# Patient Record
Sex: Male | Born: 1965 | Race: Black or African American | Hispanic: No | Marital: Single | Smoking: Current every day smoker
Health system: Southern US, Community
[De-identification: ages and names within clinical notes are randomized; demographics above are authoritative.]

## PROBLEM LIST (undated history)

## (undated) DIAGNOSIS — I499 Cardiac arrhythmia, unspecified: Secondary | ICD-10-CM

## (undated) DIAGNOSIS — F1010 Alcohol abuse, uncomplicated: Secondary | ICD-10-CM

## (undated) DIAGNOSIS — F32A Depression, unspecified: Secondary | ICD-10-CM

## (undated) DIAGNOSIS — F329 Major depressive disorder, single episode, unspecified: Secondary | ICD-10-CM

---

## 2011-05-28 ENCOUNTER — Emergency Department (HOSPITAL_COMMUNITY)
Admission: EM | Admit: 2011-05-28 | Discharge: 2011-05-28 | Disposition: A | Discharge status: Other Acute Inpatient Hospital | Payer: Self-pay | Attending: Emergency Medicine | Admitting: Emergency Medicine

## 2011-05-28 DIAGNOSIS — F329 Major depressive disorder, single episode, unspecified: Secondary | ICD-10-CM | POA: Insufficient documentation

## 2011-05-28 DIAGNOSIS — I252 Old myocardial infarction: Secondary | ICD-10-CM | POA: Insufficient documentation

## 2011-05-28 DIAGNOSIS — F3289 Other specified depressive episodes: Secondary | ICD-10-CM | POA: Insufficient documentation

## 2011-05-28 LAB — CBC
HCT: 44.5 % (ref 39.0–52.0)
Hemoglobin: 15.6 g/dL (ref 13.0–17.0)
RBC: 4.58 MIL/uL (ref 4.22–5.81)
RDW: 13.5 % (ref 11.5–15.5)
WBC: 6.7 10*3/uL (ref 4.0–10.5)

## 2011-05-28 LAB — COMPREHENSIVE METABOLIC PANEL
AST: 65 U/L — ABNORMAL HIGH (ref 0–37)
BUN: 7 mg/dL (ref 6–23)
CO2: 28 mEq/L (ref 19–32)
Calcium: 9.7 mg/dL (ref 8.4–10.5)
Chloride: 99 mEq/L (ref 96–112)
Creatinine, Ser: 0.81 mg/dL (ref 0.50–1.35)
GFR calc non Af Amer: >60 mL/min (ref >60)
Potassium: 3.7 mEq/L (ref 3.5–5.1)
Sodium: 138 mEq/L (ref 135–145)
Total Bilirubin: 0.5 mg/dL (ref 0.3–1.2)

## 2011-05-28 LAB — ETHANOL: Alcohol, Ethyl (B): <11 mg/dL (ref 0–11)

## 2011-05-28 LAB — URINALYSIS, ROUTINE W REFLEX MICROSCOPIC
Hgb urine dipstick: NEGATIVE
Specific Gravity, Urine: 1.024 (ref 1.005–1.030)
Urobilinogen, UA: 1 mg/dL (ref 0.0–1.0)
pH: 6 (ref 5.0–8.0)

## 2011-05-28 LAB — DIFFERENTIAL
Basophils Absolute: 0 10*3/uL (ref 0.0–0.1)
Eosinophils Relative: 2 % (ref 0–5)
Lymphocytes Relative: 49 % — ABNORMAL HIGH (ref 12–46)
Neutro Abs: 2.6 10*3/uL (ref 1.7–7.7)
Neutrophils Relative %: 39 % — ABNORMAL LOW (ref 43–77)

## 2011-05-28 LAB — RAPID URINE DRUG SCREEN, HOSP PERFORMED
Cocaine: NOT DETECTED
Opiates: NOT DETECTED
Tetrahydrocannabinol: NOT DETECTED

## 2011-09-02 ENCOUNTER — Emergency Department (HOSPITAL_COMMUNITY): Payer: Self-pay

## 2011-09-02 ENCOUNTER — Other Ambulatory Visit: Payer: Self-pay

## 2011-09-02 ENCOUNTER — Inpatient Hospital Stay (HOSPITAL_COMMUNITY): Payer: Self-pay

## 2011-09-02 ENCOUNTER — Encounter (HOSPITAL_COMMUNITY): Payer: Self-pay | Admitting: Radiology

## 2011-09-02 ENCOUNTER — Inpatient Hospital Stay (HOSPITAL_COMMUNITY)
Admission: EM | Admit: 2011-09-02 | Discharge: 2011-09-05 | DRG: 084 | Disposition: A | Discharge status: Home / Self Care | Payer: MEDICAID | Attending: General Surgery | Admitting: General Surgery

## 2011-09-02 DIAGNOSIS — F17200 Nicotine dependence, unspecified, uncomplicated: Secondary | ICD-10-CM | POA: Diagnosis present

## 2011-09-02 DIAGNOSIS — S069X9A Unspecified intracranial injury with loss of consciousness of unspecified duration, initial encounter: Secondary | ICD-10-CM

## 2011-09-02 DIAGNOSIS — Y92410 Unspecified street and highway as the place of occurrence of the external cause: Secondary | ICD-10-CM

## 2011-09-02 DIAGNOSIS — F329 Major depressive disorder, single episode, unspecified: Secondary | ICD-10-CM | POA: Diagnosis present

## 2011-09-02 DIAGNOSIS — S0003XA Contusion of scalp, initial encounter: Secondary | ICD-10-CM | POA: Diagnosis present

## 2011-09-02 DIAGNOSIS — S60519A Abrasion of unspecified hand, initial encounter: Secondary | ICD-10-CM | POA: Diagnosis present

## 2011-09-02 DIAGNOSIS — F3289 Other specified depressive episodes: Secondary | ICD-10-CM | POA: Diagnosis present

## 2011-09-02 DIAGNOSIS — S066X9A Traumatic subarachnoid hemorrhage with loss of consciousness of unspecified duration, initial encounter: Secondary | ICD-10-CM | POA: Diagnosis present

## 2011-09-02 DIAGNOSIS — IMO0002 Reserved for concepts with insufficient information to code with codable children: Secondary | ICD-10-CM | POA: Diagnosis present

## 2011-09-02 DIAGNOSIS — Y998 Other external cause status: Secondary | ICD-10-CM

## 2011-09-02 DIAGNOSIS — F10929 Alcohol use, unspecified with intoxication, unspecified: Secondary | ICD-10-CM

## 2011-09-02 DIAGNOSIS — S0219XA Other fracture of base of skull, initial encounter for closed fracture: Secondary | ICD-10-CM | POA: Diagnosis present

## 2011-09-02 DIAGNOSIS — S062X9A Diffuse traumatic brain injury with loss of consciousness of unspecified duration, initial encounter: Secondary | ICD-10-CM | POA: Diagnosis present

## 2011-09-02 DIAGNOSIS — S0081XA Abrasion of other part of head, initial encounter: Secondary | ICD-10-CM | POA: Diagnosis present

## 2011-09-02 DIAGNOSIS — I609 Nontraumatic subarachnoid hemorrhage, unspecified: Secondary | ICD-10-CM

## 2011-09-02 DIAGNOSIS — S02109A Fracture of base of skull, unspecified side, initial encounter for closed fracture: Principal | ICD-10-CM | POA: Diagnosis present

## 2011-09-02 DIAGNOSIS — S0083XA Contusion of other part of head, initial encounter: Secondary | ICD-10-CM

## 2011-09-02 DIAGNOSIS — S40212A Abrasion of left shoulder, initial encounter: Secondary | ICD-10-CM | POA: Diagnosis present

## 2011-09-02 DIAGNOSIS — S020XXA Fracture of vault of skull, initial encounter for closed fracture: Secondary | ICD-10-CM

## 2011-09-02 DIAGNOSIS — F10229 Alcohol dependence with intoxication, unspecified: Secondary | ICD-10-CM | POA: Diagnosis present

## 2011-09-02 DIAGNOSIS — S1093XA Contusion of unspecified part of neck, initial encounter: Secondary | ICD-10-CM

## 2011-09-02 DIAGNOSIS — S065X9A Traumatic subdural hemorrhage with loss of consciousness of unspecified duration, initial encounter: Secondary | ICD-10-CM

## 2011-09-02 DIAGNOSIS — S0291XA Unspecified fracture of skull, initial encounter for closed fracture: Secondary | ICD-10-CM

## 2011-09-02 HISTORY — DX: Alcohol abuse, uncomplicated: F10.10

## 2011-09-02 HISTORY — DX: Major depressive disorder, single episode, unspecified: F32.9

## 2011-09-02 HISTORY — DX: Depression, unspecified: F32.A

## 2011-09-02 LAB — CBC
HCT: 44.4 % (ref 39.0–52.0)
Hemoglobin: 16 g/dL (ref 13.0–17.0)
MCH: 35.6 pg — ABNORMAL HIGH (ref 26.0–34.0)
MCHC: 36 g/dL (ref 30.0–36.0)
MCV: 98.9 fL (ref 78.0–100.0)
RBC: 4.49 MIL/uL (ref 4.22–5.81)

## 2011-09-02 LAB — POCT I-STAT, CHEM 8
BUN: 5 mg/dL — ABNORMAL LOW (ref 6–23)
Calcium, Ion: 1.08 mmol/L — ABNORMAL LOW (ref 1.12–1.32)
Chloride: 102 mEq/L (ref 96–112)
Glucose, Bld: 105 mg/dL — ABNORMAL HIGH (ref 70–99)
HCT: 49 % (ref 39.0–52.0)
Potassium: 3.8 mEq/L (ref 3.5–5.1)

## 2011-09-02 MED ORDER — ONDANSETRON HCL 4 MG/2ML IJ SOLN
4.0000 mg | Freq: Once | INTRAMUSCULAR | Status: AC
  Administered 2011-09-02: 4 mg via INTRAVENOUS
  Filled 2011-09-02: qty 2

## 2011-09-02 MED ORDER — MORPHINE SULFATE 4 MG/ML IJ SOLN
4.0000 mg | Freq: Once | INTRAMUSCULAR | Status: AC
  Administered 2011-09-02: 4 mg via INTRAVENOUS

## 2011-09-02 MED ORDER — PANTOPRAZOLE SODIUM 40 MG PO TBEC
40.0000 mg | DELAYED_RELEASE_TABLET | Freq: Every day | ORAL | Status: DC
  Administered 2011-09-03 – 2011-09-05 (×3): 40 mg via ORAL
  Filled 2011-09-02 (×3): qty 1

## 2011-09-02 MED ORDER — HYDROMORPHONE HCL PF 1 MG/ML IJ SOLN
1.0000 mg | INTRAMUSCULAR | Status: DC | PRN
  Administered 2011-09-02 (×3): 1 mg via INTRAVENOUS
  Filled 2011-09-02 (×2): qty 1

## 2011-09-02 MED ORDER — PANTOPRAZOLE SODIUM 40 MG IV SOLR
40.0000 mg | Freq: Every day | INTRAVENOUS | Status: DC
  Filled 2011-09-02 (×2): qty 40

## 2011-09-02 MED ORDER — ONDANSETRON HCL 4 MG PO TABS: 4.0000 mg | ORAL_TABLET | Freq: Four times a day (QID) | ORAL | Status: DC | PRN

## 2011-09-02 MED ORDER — SODIUM CHLORIDE 0.9 % IV SOLN
Freq: Once | INTRAVENOUS | Status: AC
  Administered 2011-09-02: 18:00:00 via INTRAVENOUS

## 2011-09-02 MED ORDER — MORPHINE SULFATE 2 MG/ML IJ SOLN
2.0000 mg | INTRAMUSCULAR | Status: DC | PRN
  Administered 2011-09-03: 2 mg via INTRAVENOUS
  Filled 2011-09-02: qty 1

## 2011-09-02 MED ORDER — VITAMIN B-1 100 MG PO TABS
100.0000 mg | ORAL_TABLET | Freq: Every day | ORAL | Status: DC
  Administered 2011-09-04 – 2011-09-05 (×2): 100 mg via ORAL
  Filled 2011-09-02 (×3): qty 1

## 2011-09-02 MED ORDER — ONDANSETRON HCL 4 MG/2ML IJ SOLN
INTRAMUSCULAR | Status: AC
  Filled 2011-09-02: qty 2

## 2011-09-02 MED ORDER — MORPHINE SULFATE 4 MG/ML IJ SOLN
INTRAMUSCULAR | Status: AC
  Filled 2011-09-02: qty 1

## 2011-09-02 MED ORDER — MORPHINE SULFATE 2 MG/ML IJ SOLN
2.0000 mg | Freq: Once | INTRAMUSCULAR | Status: AC
  Administered 2011-09-02: 2 mg via INTRAVENOUS
  Filled 2011-09-02 (×2): qty 1

## 2011-09-02 MED ORDER — POTASSIUM CHLORIDE IN NACL 20-0.9 MEQ/L-% IV SOLN
INTRAVENOUS | Status: DC
  Administered 2011-09-03: via INTRAVENOUS
  Filled 2011-09-02 (×3): qty 1000

## 2011-09-02 MED ORDER — ONDANSETRON HCL 4 MG/2ML IJ SOLN
4.0000 mg | Freq: Four times a day (QID) | INTRAMUSCULAR | Status: DC | PRN
  Administered 2011-09-02: 18:00:00 via INTRAVENOUS
  Administered 2011-09-03: 4 mg via INTRAVENOUS
  Filled 2011-09-02: qty 2

## 2011-09-02 MED ORDER — DOCUSATE SODIUM 100 MG PO CAPS
100.0000 mg | ORAL_CAPSULE | Freq: Two times a day (BID) | ORAL | Status: DC
  Administered 2011-09-03 – 2011-09-05 (×5): 100 mg via ORAL
  Filled 2011-09-02 (×6): qty 1

## 2011-09-02 MED ORDER — FOLIC ACID 1 MG PO TABS
1.0000 mg | ORAL_TABLET | Freq: Every day | ORAL | Status: DC
  Administered 2011-09-03 – 2011-09-05 (×3): 1 mg via ORAL
  Filled 2011-09-02 (×3): qty 1

## 2011-09-02 MED ORDER — ONDANSETRON HCL 4 MG/2ML IJ SOLN
4.0000 mg | Freq: Once | INTRAMUSCULAR | Status: AC
  Administered 2011-09-02: 4 mg via INTRAVENOUS
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
## 2011-09-02 NOTE — ED Notes (Signed)
Pt c/o pain inn his lt ear with anabrasion to that side of his head.  Blood coming from the inside of the ear.  He remains alert moves all extremities pupils approx 3.0 equal and react to light

## 2011-09-02 NOTE — ED Notes (Signed)
The pts c-collar has been replaced x 3 he keeps taking it off.  .  His nausea is better

## 2011-09-02 NOTE — ED Notes (Signed)
Dr Andrey Campanile here to see.  The pt just pulled out both of his saline loks.  # 18 angiocath inserted in the rt a-c nss at 40ml.hr.  The pt vomited approx of mostly alcohol smelling liquid with small amounts,  Of food particles.  zofran given for the vomiting po  Order dr Letta Pate.
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
## 2011-09-02 NOTE — Progress Notes (Addendum)
Responded to page.  Spoke briefly with pt about family but pt was not responsive.  Family notified but not located by chaplain. Please page if needed. Dellie Catholic  [phone]  Chaplain was paged to escort wife back to pt's room.  Relationship appears to be strained.  Left wife with pt.  Pt still uncooperative.

## 2011-09-02 NOTE — ED Notes (Signed)
The pt continues to be restless and confused.  He is moving all extremties and his pupils remain equal and reactive.  He also has intermittent nausea restless he keeps attempting to remove his iv.  Wife at the bedside but she appear to have difficulty  Dealing with the pts condition

## 2011-09-02 NOTE — ED Notes (Signed)
The pt is requiring constant .  Portable xray finished.  Iv # 16 rt hand by tia saul rn.  nss to run per caprossi
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
## 2011-09-02 NOTE — ED Notes (Signed)
The pt has removed his c-collar again replaced.  Asking to sit upright.  He continues to c/o a headache.  Moving all his extremities.  His wife remains at the beside.  Pupils equal and react

## 2011-09-02 NOTE — ED Notes (Signed)
Dr Andrey Campanile ordered morphine 2 mg iv for lt ear pain

## 2011-09-02 NOTE — ED Notes (Signed)
hwp here to see. ,pt going to c-t scan

## 2011-09-02 NOTE — ED Notes (Signed)
Dr Jeral Fruit here to see the pt

## 2011-09-02 NOTE — ED Notes (Signed)
Caporossi, MD notified of abnormal lab test results, wants a redraw of blood and test reran; notified lab to restick pt
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
## 2011-09-02 NOTE — ED Notes (Signed)
The  Pt has a bed assignment waiting to give report
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
## 2011-09-02 NOTE — ED Notes (Signed)
Vital signs stable. 
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
## 2011-09-02 NOTE — ED Notes (Signed)
Caporossi, MD handed redrawn lab test results
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
## 2011-09-02 NOTE — ED Notes (Signed)
Family at beside. Family given emotional support. 
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
## 2011-09-02 NOTE — ED Notes (Signed)
The pts pupils are 3.0 equal and reactive.  He is moving all extremities.  C/o a headache.

## 2011-09-02 NOTE — ED Notes (Signed)
The pt is alert but confused his pupils are 3.0 and equal with reaction to light.  He moves all extremities.  He  Continues to take the c-collar off replaced

## 2011-09-03 ENCOUNTER — Inpatient Hospital Stay (HOSPITAL_COMMUNITY): Payer: Self-pay

## 2011-09-03 LAB — CBC
HCT: 40.8 % (ref 39.0–52.0)
MCHC: 35.3 g/dL (ref 30.0–36.0)
MCV: 98.6 fL (ref 78.0–100.0)
RDW: 12.7 % (ref 11.5–15.5)

## 2011-09-03 LAB — MRSA PCR SCREENING: MRSA by PCR: NEGATIVE

## 2011-09-03 LAB — BASIC METABOLIC PANEL
BUN: 8 mg/dL (ref 6–23)
Creatinine, Ser: 0.82 mg/dL (ref 0.50–1.35)
GFR calc Af Amer: >90 mL/min (ref >90)
GFR calc non Af Amer: >90 mL/min (ref >90)

## 2011-09-03 MED ORDER — PNEUMOCOCCAL VAC POLYVALENT 25 MCG/0.5ML IJ INJ
0.5000 mL | INJECTION | INTRAMUSCULAR | Status: AC
  Filled 2011-09-03: qty 0.5

## 2011-09-03 MED ORDER — OXYCODONE HCL 5 MG PO TABS
5.0000 mg | ORAL_TABLET | ORAL | Status: DC | PRN
  Administered 2011-09-03 – 2011-09-04 (×2): 5 mg via ORAL
  Filled 2011-09-03 (×2): qty 1

## 2011-09-03 MED ORDER — INFLUENZA VIRUS VACC SPLIT PF IM SUSP
0.5000 mL | INTRAMUSCULAR | Status: AC
  Filled 2011-09-03: qty 0.5

## 2011-09-03 NOTE — Progress Notes (Signed)
Follow up - Critical Care Medicine Note  Patient Details:    Brandon Brady is an 45 y.o. male.  Lines/tubes :   Microbiology/Sepsis markers: Results for orders placed during the hospital encounter of 09/02/11  MRSA PCR SCREENING     Status: Normal   Collection Time   09/02/11 10:57 PM      Component Value Range Status Comment   MRSA by PCR NEGATIVE  NEGATIVE  Final     Anti-infectives:  Anti-infectives    None      Best Practice/Protocols:  VTE Prophylaxis: Mechanical CIWA protocol to be instituted  Consults: Treatment Team:  Karn Cassis    Events:  Subjective:    Overnight Issues: No new issues tonight.  Objective:  Vital signs for last 24 hours: Temp:  [98.4 F (36.9 C)-99.9 F (37.7 C)] 99.3 F (37.4 C) (12/12 0800) Pulse Rate:  [60-91] 69  (12/12 1100) Resp:  [11-25] 18  (12/12 1100) BP: (88-134)/(44-89) 123/83 mmHg (12/12 1000) SpO2:  [96 %-100 %] 98 % (12/12 1100) Weight:  [156 lb 4.9 oz (70.9 kg)] 156 lb 4.9 oz (70.9 kg) (12/11 2300)  Hemodynamic parameters for last 24 hours:    Intake/Output from previous day: 12/11 0701 - 12/12 0700 In: 1090 [I.V.:1090] Out: 900 [Urine:700; Emesis/NG output:200]  Intake/Output this shift: Total I/O In: 300 [I.V.:300] Out: -   Vent settings for last 24 hours:    Physical Exam:  General: alert, no respiratory distress and occasionally confused according to the nurse Neuro: alert, oriented and nonfocal exam Resp: clear to auscultation bilaterally GI: soft, nontender, BS WNL, no r/g  Results for orders placed during the hospital encounter of 09/02/11 (from the past 24 hour(s))  CBC     Status: Abnormal   Collection Time   09/02/11  4:04 PM      Component Value Range   WBC 11.3 (*) 4.0 - 10.5 (K/uL)   RBC 4.49  4.22 - 5.81 (MIL/uL)   Hemoglobin 16.0  13.0 - 17.0 (g/dL)   HCT 40.9  81.1 - 91.4 (%)   MCV 98.9  78.0 - 100.0 (fL)   MCH 35.6 (*) 26.0 - 34.0 (pg)   MCHC 36.0  30.0 - 36.0 (g/dL)   RDW 78.2  95.6 - 21.3 (%)   Platelets 261  150 - 400 (K/uL)  PROTIME-INR     Status: Normal   Collection Time   09/02/11  4:04 PM      Component Value Range   Prothrombin Time 12.5  11.6 - 15.2 (seconds)   INR 0.91  0.00 - 1.49   ETHANOL     Status: Abnormal   Collection Time   09/02/11  4:04 PM      Component Value Range   Alcohol, Ethyl (B) 247 (*) 0 - 11 (mg/dL)  POCT I-STAT, CHEM 8     Status: Abnormal   Collection Time   09/02/11  4:12 PM      Component Value Range   Sodium 134 (*) 135 - 145 (mEq/L)   Potassium 8.2 (*) 3.5 - 5.1 (mEq/L)   Chloride 103  96 - 112 (mEq/L)   BUN 7  6 - 23 (mg/dL)   Creatinine, Ser 0.86  0.50 - 1.35 (mg/dL)   Glucose, Bld 578 (*) 70 - 99 (mg/dL)   Calcium, Ion 4.69 (*) 1.12 - 1.32 (mmol/L)   TCO2 24  0 - 100 (mmol/L)   Hemoglobin 17.0  13.0 - 17.0 (g/dL)   HCT 50.0  39.0 - 52.0 (%)  POCT I-STAT, CHEM 8     Status: Abnormal   Collection Time   09/02/11  4:43 PM      Component Value Range   Sodium 139  135 - 145 (mEq/L)   Potassium 3.8  3.5 - 5.1 (mEq/L)   Chloride 102  96 - 112 (mEq/L)   BUN 5 (*) 6 - 23 (mg/dL)   Creatinine, Ser 0.86  0.50 - 1.35 (mg/dL)   Glucose, Bld 578 (*) 70 - 99 (mg/dL)   Calcium, Ion 4.69 (*) 1.12 - 1.32 (mmol/L)   TCO2 24  0 - 100 (mmol/L)   Hemoglobin 16.7  13.0 - 17.0 (g/dL)   HCT 62.9  52.8 - 41.3 (%)  MRSA PCR SCREENING     Status: Normal   Collection Time   09/02/11 10:57 PM      Component Value Range   MRSA by PCR NEGATIVE  NEGATIVE   CBC     Status: Abnormal   Collection Time   09/03/11  3:50 AM      Component Value Range   WBC 16.1 (*) 4.0 - 10.5 (K/uL)   RBC 4.14 (*) 4.22 - 5.81 (MIL/uL)   Hemoglobin 14.4  13.0 - 17.0 (g/dL)   HCT 24.4  01.0 - 27.2 (%)   MCV 98.6  78.0 - 100.0 (fL)   MCH 34.8 (*) 26.0 - 34.0 (pg)   MCHC 35.3  30.0 - 36.0 (g/dL)   RDW 53.6  64.4 - 03.4 (%)   Platelets 240  150 - 400 (K/uL)  BASIC METABOLIC PANEL     Status: Abnormal   Collection Time   09/03/11  3:50 AM       Component Value Range   Sodium 137  135 - 145 (mEq/L)   Potassium 4.6  3.5 - 5.1 (mEq/L)   Chloride 102  96 - 112 (mEq/L)   CO2 22  19 - 32 (mEq/L)   Glucose, Bld 117 (*) 70 - 99 (mg/dL)   BUN 8  6 - 23 (mg/dL)   Creatinine, Ser 7.42  0.50 - 1.35 (mg/dL)   Calcium 9.3  8.4 - 59.5 (mg/dL)   GFR calc non Af Amer >90  >90 (mL/min)   GFR calc Af Amer >90  >90 (mL/min)     Assessment/Plan:   NEURO  Altered Mental Status:  drug withdrawl syndrome   Plan: Possibly start CIWA protocol for ETOH withdrawal  PULM  No issues   Plan: No changes  CARDIO  No issues   Plan: No changes  RENAL  No issues   Plan: No changes  GI  No issuesA   Plan: Advance diet  ID  No issues   Plan: No changes  HEME  No new issues   Plan: No changes  ENDO No issues   Plan: No changes  Global Issues  The patients WBC is elevated, but I see no problems with infection.  Will check UA    LOS: 1 day   Additional comments:I reviewed the patient's new clinical lab test results. CBC/Bmet and I reviewed the patients new imaging test results. CT head  Repeat CT head does not show much worsening.  Clinically he is improved.  Critical Care Total Time*: 30 Minutes  Seona Clemenson III,Bristyn Kulesza O 09/03/2011  *Care during the described time interval was provided by me and/or other providers on the critical care team.  I have reviewed this patient's available data, including medical history, events of note, physical examination  and test results as part of my evaluation.

## 2011-09-03 NOTE — Progress Notes (Signed)
Clinical Social Worker completed the psychosocial assessment which can be found in the shadow chart.  Patient did state that he currently drinks alcohol daily (3-5 beers/day) and at times does black out.  Patient understands that he cannot continue with his current drinking habits and acknowledges that treatment may be an option.  Patient agreeable for substance abuse treatment centers.  CSW provided patient with treatment center options.  Clinical Social Worker has completed SBIRT with patient at bedside.  Clinical Social Worker will sign off for now as social work intervention is no longer needed. Please consult [place] again if new need arises.  [address] [place], [place] [phone]

## 2011-09-03 NOTE — Progress Notes (Addendum)
C- COLLAR REMOVED BY PHYSICIAN: J. WYATT AT 11:30A  11:45 Ace wrap applied to L wrist per MD  Lennie Muckle Leigh  12:00 attempted IV access d/t loss of previous IV site. Unsuccessful, pt refused other attempts. MD aware. IV access order d/c.   Holly Bodily
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
## 2011-09-03 NOTE — ED Provider Notes (Signed)
I saw and evaluated the patient, reviewed the resident's note and I agree with the findings and plan.  Nicholes Stairs, MD 09/03/11 [phone]

## 2011-09-04 LAB — POCT I-STAT, CHEM 8
BUN: 7 mg/dL (ref 6–23)
Chloride: 103 meq/L (ref 96–112)
Creatinine, Ser: 1.2 mg/dL (ref 0.50–1.35)
Glucose, Bld: 107 mg/dL — ABNORMAL HIGH (ref 70–99)
Hemoglobin: 17 g/dL (ref 13.0–17.0)
Potassium: 8.2 meq/L (ref 3.5–5.1)
Sodium: 134 meq/L — ABNORMAL LOW (ref 135–145)

## 2011-09-04 MED ORDER — THERA M PLUS PO TABS
1.0000 | ORAL_TABLET | Freq: Every day | ORAL | Status: DC
  Administered 2011-09-04 – 2011-09-05 (×2): 1 via ORAL
  Filled 2011-09-04 (×2): qty 1

## 2011-09-04 MED ORDER — LORAZEPAM 1 MG PO TABS
1.0000 mg | ORAL_TABLET | Freq: Four times a day (QID) | ORAL | Status: DC | PRN
  Filled 2011-09-04: qty 1

## 2011-09-04 MED ORDER — LORAZEPAM 1 MG PO TABS: 0.0000 mg | ORAL_TABLET | Freq: Two times a day (BID) | ORAL | Status: DC

## 2011-09-04 MED ORDER — NICOTINE 21 MG/24HR TD PT24
21.0000 mg | MEDICATED_PATCH | TRANSDERMAL | Status: DC
  Administered 2011-09-04 – 2011-09-05 (×2): 21 mg via TRANSDERMAL
  Filled 2011-09-04 (×3): qty 1

## 2011-09-04 MED ORDER — LORAZEPAM 1 MG PO TABS
0.0000 mg | ORAL_TABLET | Freq: Four times a day (QID) | ORAL | Status: DC
  Administered 2011-09-04 – 2011-09-05 (×5): 1 mg via ORAL
  Filled 2011-09-04 (×4): qty 1

## 2011-09-04 MED ORDER — LORAZEPAM 2 MG/ML IJ SOLN: 1.0000 mg | Freq: Four times a day (QID) | INTRAMUSCULAR | Status: DC | PRN

## 2011-09-04 MED ORDER — OXYCODONE HCL 5 MG PO TABS
5.0000 mg | ORAL_TABLET | ORAL | Status: DC | PRN
  Administered 2011-09-04 (×2): 10 mg via ORAL
  Administered 2011-09-04: 15 mg via ORAL
  Administered 2011-09-04 – 2011-09-05 (×2): 10 mg via ORAL
  Administered 2011-09-05: 15 mg via ORAL
  Administered 2011-09-05: 10 mg via ORAL
  Administered 2011-09-05: 15 mg via ORAL
  Filled 2011-09-04 (×2): qty 3
  Filled 2011-09-04: qty 2
  Filled 2011-09-04: qty 3
  Filled 2011-09-04 (×2): qty 2
  Filled 2011-09-04: qty 3
  Filled 2011-09-04: qty 2
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
## 2011-09-04 NOTE — Progress Notes (Signed)
Patient ID: Brandon Brady, male   DOB: Apr 11, 1966, 45 y.o.   MRN: [id number] Vs seen. Patient stable. No more blood or csf in ear. Decrease of headache. Ct head of yesterday seen.wi get a new ct tomorrow.
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
## 2011-09-05 ENCOUNTER — Inpatient Hospital Stay (HOSPITAL_COMMUNITY): Payer: Self-pay

## 2011-09-05 LAB — BASIC METABOLIC PANEL WITH GFR
BUN: 13 mg/dL (ref 6–23)
CO2: 26 meq/L (ref 19–32)
Calcium: 9.5 mg/dL (ref 8.4–10.5)
Chloride: 96 meq/L (ref 96–112)
Creatinine, Ser: 0.85 mg/dL (ref 0.50–1.35)
GFR calc Af Amer: >90 mL/min
GFR calc non Af Amer: >90 mL/min
Glucose, Bld: 97 mg/dL (ref 70–99)
Potassium: 4.1 meq/L (ref 3.5–5.1)
Sodium: 133 meq/L — ABNORMAL LOW (ref 135–145)

## 2011-09-05 LAB — CBC
MCH: 34.9 pg — ABNORMAL HIGH (ref 26.0–34.0)
MCHC: 35 g/dL (ref 30.0–36.0)
MCV: 99.8 fL (ref 78.0–100.0)
Platelets: 263 10*3/uL (ref 150–400)
RBC: 4.3 MIL/uL (ref 4.22–5.81)

## 2011-09-05 MED ORDER — OXYCODONE HCL 5 MG PO TABS: 5.0000 mg | ORAL_TABLET | ORAL | Status: DC | PRN

## 2011-09-05 MED ORDER — DSS 100 MG PO CAPS: 100.0000 mg | ORAL_CAPSULE | Freq: Two times a day (BID) | ORAL | Status: DC

## 2011-09-05 MED ORDER — THERA M PLUS PO TABS: 1.0000 | ORAL_TABLET | Freq: Every day | ORAL | Status: DC

## 2011-09-05 NOTE — Progress Notes (Signed)
This patient has been seen and I agree with the findings and treatment plan.  Taiz Bickle O. Tora Prunty, III, MD, FACS [phone] (pager) [phone] (direct pager) Trauma Surgeon  

## 2011-09-05 NOTE — Progress Notes (Signed)
Clinical Social Worker received phone call from MD regarding patient family requesting substance use resources on patient behalf.  CSW met with patient, patient wife, and patient uncle at bedside to discuss the possibilities and options for patient in regards to substance abuse treatment.  Patient having a hard time staying awake but provided permission to speak with patient wife and discuss options.  Patient is agreeable to initiate referral to inpatient substance abuse facility.  Patient provided permission for facility to contact patient wife with any follow up needs.  Patient wife and CSW stepped into the hallway to further discuss patient situation.  Patient wife states that they are currently separated and patient has been living on sister's couch.  Patient has been out of work for at least 6 months and expressing signs of serious depression and substance abuse.  Patient wife committed patient about 5 months ago for possible suicidal ideation and patient participated in a 7 day program in [place], [place].  Patient wife is agreeable to have patient return home with her, and provide 24 hour care until patient has had the opportunity to physically heal and admit to residential substance abuse facility.  Patient wife states that patient will not be drinking in her home at discharge.  Clinical Social Worker initiated a referral to [facility] in [place], [place].  CSW spoke with Deland Pretty (Lead Clinician) who made arrangements for patient to participate in intake process on September 18, 2011 at 08:00.  Clinical Social Worker notified patient wife who was very relieved at the idea of patient having an appointment arranged prior to discharge from the hospital.  Clinical Social Worker will provide facility with patient discharge summary upon discharge from this admission in order for facility to continue with patient referral.    Patient wife anxious to get patient home and  settled.  Patient wife is prepared to take patient home at discharge and provide patient with transportation to doctor appointments and residential treatment.    Clinical Social Worker will sign off for now as social work intervention is no longer needed. Please consult [place] again if new need arises.  [address] [place], [place] [phone]

## 2011-09-05 NOTE — Progress Notes (Signed)
Pt d/c in stable condition via wife who stated he will have 24hr supervision. Instructions reviewed with wife and pt. Instructed about follow up appointments and post visit care.

## 2011-09-05 NOTE — Progress Notes (Signed)
Physical Therapy Treatment Patient Details Name: Brandon Brady MRN: [id number] DOB: 03/17/1966 Today's Date: 09/05/2011  PT Assessment/Plan  PT - Assessment/Plan Comments on Treatment Session: Patient presents at [facility] Level [facility]. SLP currently educating spouse regarding brain injury and will supply with brain injury book. Both myself and Shawn RAyburn PA discussed with spouse regarding the need for 24 hour care at discharge. Patients spouse states she can provide if she has note to inform her work.  PT Plan: Discharge plan remains appropriate Follow Up Recommendations: 24 hour supervision/assistance;Outpatient PT Equipment Recommended: None recommended by PT PT Goals  Acute Rehab PT Goals PT Goal: Supine/Side to Sit - Progress: Progressing toward goal PT Goal: Sit to Supine/Side - Progress: Progressing toward goal PT Goal: Sit to Stand - Progress: Progressing toward goal PT Goal: Stand to Sit - Progress: Progressing toward goal PT Goal: Ambulate - Progress: Progressing toward goal  PT Treatment Precautions/Restrictions  Precautions Precautions: Fall Precaution Comments: Secondary to poor awareness and BPPV Restrictions Weight Bearing Restrictions: No Mobility (including Balance) Bed Mobility Rolling Right: 5: Supervision Rolling Right Details (indicate cue type and reason): Increased time to achieve position Right Sidelying to Sit: 4: Min assist Right Sidelying to Sit Details (indicate cue type and reason): Patient with difficulty initiating trunk  Supine to Sit: 4: Min assist;HOB flat Supine to Sit Details (indicate cue type and reason): Patient reaching arms up toward PT as if to ask for assistance, but does not ask. Patient with difficulty initiating secondary to head pain and neck stiffness Sit to Supine - Right: 5: Supervision;HOB elevated (comment degrees) Sit to Supine - Right Details (indicate cue type and reason): 30 degrees Transfers Sit to Stand: 4: Min assist;From  bed;From chair/3-in-1 Sit to Stand Details (indicate cue type and reason): Min-guard assistance  - patient able to initiate today without assistance Stand to Sit: 5: Supervision;To bed;To chair/3-in-1 Stand to Sit Details: Controls descent well to chair Ambulation/Gait Ambulation/Gait Assistance: 4: Min assist Ambulation/Gait Assistance Details (indicate cue type and reason): Min-guard assistance. No evidence of deviation from path with head turns. Speed and stride length increase with increased distance. Patient with tendency to reach for rails in hallways for support.  Ambulation Distance (Feet): 250 Feet Assistive device: None  Static Standing Balance Static Standing - Balance Support: No upper extremity supported Static Standing - Level of Assistance: 5: Stand by assistance Had patient lie from long sitting with bed in Trendelenberg position and head rotated 20 degrees to left. Patient moves slowly secondary to pain and cognition. Nystagmus present for 15 seconds indicating left BPPV posterior canal. Had patient turn head 20 degrees to right -  post dizziness abolished + 30 seconds. Then rolled to right side and then up to sitting looking at floor.  Nystagmus with lower duration today, and overall patient states dizziness less although can not rate. No dizziness with sit to stand or gait today. Unable to assess for possible peri-lymphatic fistula (often associated with temporal bone fractures) secondary to patients inability to accurately follow commands. Exercise  Other Exercises Other Exercises: Educated Brandt-Daroff exercises to patients spouse as patient unable to sustain attention for education. Handout of exercises with full instructions supplied. Also supplied education handout to patient's spouse regarding BPPV End of Session PT - End of Session Equipment Utilized During Treatment: Gait belt Activity Tolerance: Patient limited by fatigue Patient left: in bed;with family/visitor  present General Behavior During Session: Flat affect (Lethargic) Cognition: Impaired Cognitive Impairment: Patient only able to sustain attention for 1  minute maximum today. Orientated to place, situation. Unable to get patient to follow multiple step commands consistantly. Decreased safety awareness. Per spouses report long term memory issues based on discussions she has had with patient. Patient asking if sister has moved to [place] yet - sister lives locally and will not be and has not moved.   Edwyna Perfect, PT  Pager [phone]  09/05/2011, 10:28 AM
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
## 2011-09-11 ENCOUNTER — Encounter (HOSPITAL_COMMUNITY): Payer: Self-pay

## 2011-09-11 ENCOUNTER — Emergency Department (HOSPITAL_COMMUNITY)
Admission: EM | Admit: 2011-09-11 | Discharge: 2011-09-11 | Disposition: A | Discharge status: Home / Self Care | Payer: Self-pay | Attending: Emergency Medicine | Admitting: Emergency Medicine

## 2011-09-11 DIAGNOSIS — R51 Headache: Secondary | ICD-10-CM | POA: Insufficient documentation

## 2011-09-11 DIAGNOSIS — M549 Dorsalgia, unspecified: Secondary | ICD-10-CM | POA: Insufficient documentation

## 2011-09-11 DIAGNOSIS — F17200 Nicotine dependence, unspecified, uncomplicated: Secondary | ICD-10-CM | POA: Insufficient documentation

## 2011-09-11 HISTORY — DX: Cardiac arrhythmia, unspecified: I49.9

## 2011-09-11 MED ORDER — OXYCODONE HCL 15 MG PO TABS: ORAL_TABLET | ORAL | Status: DC

## 2011-09-11 MED ORDER — OXYCODONE HCL 5 MG PO TABS
15.0000 mg | ORAL_TABLET | Freq: Once | ORAL | Status: AC
  Administered 2011-09-11: 15 mg via ORAL
  Filled 2011-09-11: qty 3
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
## 2011-09-11 NOTE — ED Notes (Signed)
Pt signed for discharge instructions but I accidentally hit cancel.  Written instructions and Rx given to pt with verbal understanding voiced.
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
## 2011-10-01 NOTE — Discharge Summary (Signed)
This patient has been seen and I agree with the findings and treatment plan.  Azarya Oconnell O. Gregroy Dombkowski, III, MD, FACS [phone] (pager) [phone] (direct pager) Trauma Surgeon  

## 2012-09-17 ENCOUNTER — Encounter (HOSPITAL_BASED_OUTPATIENT_CLINIC_OR_DEPARTMENT_OTHER): Payer: Self-pay

## 2012-09-17 ENCOUNTER — Emergency Department (HOSPITAL_BASED_OUTPATIENT_CLINIC_OR_DEPARTMENT_OTHER)
Admission: EM | Admit: 2012-09-17 | Discharge: 2012-09-17 | Disposition: A | Discharge status: Home / Self Care | Payer: Self-pay | Attending: Emergency Medicine | Admitting: Emergency Medicine

## 2012-09-17 ENCOUNTER — Emergency Department (HOSPITAL_BASED_OUTPATIENT_CLINIC_OR_DEPARTMENT_OTHER): Payer: Self-pay

## 2012-09-17 DIAGNOSIS — F1010 Alcohol abuse, uncomplicated: Secondary | ICD-10-CM | POA: Insufficient documentation

## 2012-09-17 DIAGNOSIS — Z87828 Personal history of other (healed) physical injury and trauma: Secondary | ICD-10-CM | POA: Insufficient documentation

## 2012-09-17 DIAGNOSIS — F17200 Nicotine dependence, unspecified, uncomplicated: Secondary | ICD-10-CM | POA: Insufficient documentation

## 2012-09-17 DIAGNOSIS — Z8659 Personal history of other mental and behavioral disorders: Secondary | ICD-10-CM | POA: Insufficient documentation

## 2012-09-17 DIAGNOSIS — Z8679 Personal history of other diseases of the circulatory system: Secondary | ICD-10-CM | POA: Insufficient documentation

## 2012-09-17 DIAGNOSIS — R51 Headache: Secondary | ICD-10-CM | POA: Insufficient documentation

## 2012-09-17 DIAGNOSIS — R42 Dizziness and giddiness: Secondary | ICD-10-CM | POA: Insufficient documentation
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
## 2012-09-25 NOTE — ED Provider Notes (Signed)
Medical screening examination/treatment/procedure(s) were performed by non-physician practitioner and as supervising physician I was immediately available for consultation/collaboration.   Gwyneth Sprout, MD 09/25/12 1003
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
## 2015-06-14 ENCOUNTER — Ambulatory Visit (INDEPENDENT_AMBULATORY_CARE_PROVIDER_SITE_OTHER): Payer: BLUE CROSS/BLUE SHIELD | Admitting: Psychiatry

## 2015-06-14 ENCOUNTER — Encounter (INDEPENDENT_AMBULATORY_CARE_PROVIDER_SITE_OTHER): Payer: Self-pay

## 2015-06-14 ENCOUNTER — Encounter (HOSPITAL_COMMUNITY): Payer: Self-pay | Admitting: Psychiatry

## 2015-06-14 VITALS — BP 116/78 | HR 78 | Ht 70.5 in | Wt 153.8 lb

## 2015-06-14 DIAGNOSIS — F339 Major depressive disorder, recurrent, unspecified: Secondary | ICD-10-CM | POA: Diagnosis not present

## 2015-06-14 DIAGNOSIS — S069XAS Unspecified intracranial injury with loss of consciousness status unknown, sequela: Secondary | ICD-10-CM

## 2015-06-14 DIAGNOSIS — F1024 Alcohol dependence with alcohol-induced mood disorder: Secondary | ICD-10-CM

## 2015-06-14 DIAGNOSIS — F17200 Nicotine dependence, unspecified, uncomplicated: Secondary | ICD-10-CM | POA: Insufficient documentation

## 2015-06-14 DIAGNOSIS — F322 Major depressive disorder, single episode, severe without psychotic features: Secondary | ICD-10-CM

## 2015-06-14 DIAGNOSIS — S020XXS Fracture of vault of skull, sequela: Secondary | ICD-10-CM

## 2015-06-14 DIAGNOSIS — S069X9S Unspecified intracranial injury with loss of consciousness of unspecified duration, sequela: Secondary | ICD-10-CM

## 2015-06-14 DIAGNOSIS — S0219XA Other fracture of base of skull, initial encounter for closed fracture: Secondary | ICD-10-CM | POA: Insufficient documentation

## 2015-06-14 DIAGNOSIS — S069X9A Unspecified intracranial injury with loss of consciousness of unspecified duration, initial encounter: Secondary | ICD-10-CM

## 2015-06-14 DIAGNOSIS — S0219XS Other fracture of base of skull, sequela: Secondary | ICD-10-CM

## 2015-06-14 DIAGNOSIS — F17210 Nicotine dependence, cigarettes, uncomplicated: Secondary | ICD-10-CM

## 2015-06-14 DIAGNOSIS — F411 Generalized anxiety disorder: Secondary | ICD-10-CM | POA: Diagnosis not present

## 2015-06-14 DIAGNOSIS — F6381 Intermittent explosive disorder: Secondary | ICD-10-CM | POA: Insufficient documentation

## 2015-06-14 DIAGNOSIS — F1020 Alcohol dependence, uncomplicated: Secondary | ICD-10-CM

## 2015-06-14 MED ORDER — RISPERIDONE 1 MG PO TABS: 1.0000 mg | ORAL_TABLET | Freq: Every day | ORAL | Status: DC
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
## 2015-06-27 ENCOUNTER — Ambulatory Visit (HOSPITAL_COMMUNITY)
Admission: RE | Admit: 2015-06-27 | Discharge: 2015-06-27 | Disposition: A | Discharge status: Home / Self Care | Payer: BLUE CROSS/BLUE SHIELD | Source: Ambulatory Visit | Attending: Psychiatry | Admitting: Psychiatry

## 2015-06-27 DIAGNOSIS — X58XXXS Exposure to other specified factors, sequela: Secondary | ICD-10-CM | POA: Diagnosis not present

## 2015-06-27 DIAGNOSIS — S020XXS Fracture of vault of skull, sequela: Secondary | ICD-10-CM | POA: Insufficient documentation

## 2015-06-27 DIAGNOSIS — F322 Major depressive disorder, single episode, severe without psychotic features: Secondary | ICD-10-CM

## 2015-06-27 DIAGNOSIS — S069X9S Unspecified intracranial injury with loss of consciousness of unspecified duration, sequela: Secondary | ICD-10-CM | POA: Insufficient documentation

## 2015-06-27 DIAGNOSIS — F411 Generalized anxiety disorder: Secondary | ICD-10-CM | POA: Diagnosis not present

## 2015-06-27 NOTE — Procedures (Signed)
ELECTROENCEPHALOGRAM REPORT   Patient: Brandon Brady       Room #: OP EEG No. ID: [id number] Age: 49 y.o.        Sex: male Referring Physician: Rutherford Limerick Report Date:  06/27/2015        Interpreting Physician: Thana Farr  History: Brandon Brady is an 49 y.o. male with a history of anger outbursts  Medications:  Not listed  Conditions of Recording:  This is a 16 channel EEG carried out with the patient in the awake and drowsy states.  Description:  The waking background activity consists of a low voltage, symmetrical, fairly well organized, 11 Hz alpha activity, seen from the parieto-occipital and posterior temporal regions.  Low voltage fast activity, poorly organized, is seen anteriorly and is at times superimposed on more posterior regions.  A mixture of theta and alpha rhythms are seen from the central and temporal regions. The patient drowses with slowing to irregular, low voltage theta and beta activity.   Stage II sleep is not obtained. No epileptiform activity is noted.   Hyperventilation was performed and produced a mild to moderate buildup but failed to elicit any abnormalities.  Intermittent photic stimulation was performed and elicits a symmetrical driving response but fails to elicit any abnormalities.  IMPRESSION: Normal electroencephalogram, awake, drowsy and with activation procedures. There are no focal lateralizing or epileptiform features.   Thana Farr, MD [facility] [phone] 06/27/2015, 10:40 AM

## 2015-06-27 NOTE — Progress Notes (Signed)
EEG Completed; Results Pending  

## 2015-07-03 ENCOUNTER — Encounter (HOSPITAL_COMMUNITY): Payer: Self-pay | Admitting: Psychiatry

## 2015-07-03 ENCOUNTER — Ambulatory Visit (INDEPENDENT_AMBULATORY_CARE_PROVIDER_SITE_OTHER): Payer: BLUE CROSS/BLUE SHIELD | Admitting: Psychiatry

## 2015-07-03 VITALS — BP 144/92 | HR 97 | Ht 70.5 in | Wt 158.6 lb

## 2015-07-03 DIAGNOSIS — F332 Major depressive disorder, recurrent severe without psychotic features: Secondary | ICD-10-CM

## 2015-07-03 DIAGNOSIS — F6381 Intermittent explosive disorder: Secondary | ICD-10-CM | POA: Diagnosis not present

## 2015-07-03 DIAGNOSIS — F1020 Alcohol dependence, uncomplicated: Secondary | ICD-10-CM

## 2015-07-03 DIAGNOSIS — F411 Generalized anxiety disorder: Secondary | ICD-10-CM

## 2015-07-03 DIAGNOSIS — F17200 Nicotine dependence, unspecified, uncomplicated: Secondary | ICD-10-CM

## 2015-07-03 DIAGNOSIS — S069X9S Unspecified intracranial injury with loss of consciousness of unspecified duration, sequela: Secondary | ICD-10-CM

## 2015-07-03 DIAGNOSIS — F17290 Nicotine dependence, other tobacco product, uncomplicated: Secondary | ICD-10-CM

## 2015-07-03 MED ORDER — RISPERIDONE 1 MG PO TABS: 1.0000 mg | ORAL_TABLET | Freq: Every day | ORAL | Status: AC
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
## 2015-08-04 ENCOUNTER — Other Ambulatory Visit (HOSPITAL_COMMUNITY): Payer: Self-pay | Admitting: Psychiatry

## 2015-08-06 ENCOUNTER — Ambulatory Visit (HOSPITAL_COMMUNITY): Payer: Self-pay | Admitting: Psychiatry

## 2020-03-22 DEATH — deceased

## 2021-11-26 ENCOUNTER — Telehealth

## 2021-11-26 NOTE — Telephone Encounter
Call Back Request      Reason for call back: Patient is requesting a Semen Analysis with consult . Currently has Medical . Reach out to patient to confirm if it is ok to pay as cash 175  With out effecting his medical benefits   Any Symptoms:  []  Yes  [x]  No       If yes, what symptoms are you experiencing:    o Duration of symptoms (how long):    o Have you taken medication for symptoms (OTC or Rx):      If call was taken outside of clinic hours:    [] Patient or caller has been notified that this message was sent outside of normal clinic hours.     [] Patient or caller has been warm transferred to the physician's answering service. If applicable, patient or caller informed to please call back if symptoms progress.  Patient or caller has been notified of the turnaround time of 1-2 business day(s).

## 2021-11-28 NOTE — Telephone Encounter
Returned pts call and informed him that medical doesn't cover infertility that if he were to pay out of pocket it would affect his benefits. He asked why and I told him from my understanding that iif he were to pay out of pocket it would show he has the means and would no longer qualify. Pt asked to keep appt and would call back if he needed to cancel.

## 2022-01-23 NOTE — Consults
PATIENT:  Mark Goodwin   MRN:  [id number]  DOB:  [birth year]  DATE OF SERVICE:  01/30/2022  PRIMARY CARE PROVIDER: No primary care provider on file.    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    HISTORY OF PRESENT ILLNESS     Mark Goodwin is a 56 y.o. male married to ***, a G*** P*** *** year-old male with *** evaluation. They have been attempting pregnancy for *** without success. *** has no previous children. He is otherwise healthy. He *** known exposures to excessive heat, toxins or radiation. He *** physically active. He has no difficulties achieving or maintaining erections, denies painful or bloody ejaculations. He has a *** libido.    PAST MEDICAL HISTORY     No past medical history on file.    PAST SURGICAL HISTORY     No past surgical history on file.    MEDICATIONS     No current outpatient medications on file.     No current facility-administered medications for this visit.        ALLERGIES     Not on File     SOCIAL HISTORY          Occupation: ***    FAMILY HISTORY     No family history on file.      *** for urologic malignancy or fertility    REVIEW OF SYSTEMS     Review of Systems: A detailed 14 point review of systems was ascertained today including: endocrine, integument, cardiovascular, pulmonary, GI, urinary, hematologic, neurologic/psychiatric. All systems negative except as documented above.  Pertinent positives include: none unless otherwise listed in HPI.     PHYSICAL EXAM     Patient declined chaperone.       General:  The patient is a well-developed and well nourished male in no acute distress with normal attention to grooming. The patient is awake, alert and cooperative.    HEENT: normocephalic, PERRLA, normal ear and nose structures, with no scars or lesions.    Skin: warm, dry with normal turgor, with no lesions seen.    Neck: supple, no bruits, no lymph nodes palpable.    Lungs: normal respiratory effort.    Chest/ Heart: regular rate. *** gynecomastia.    Abdomen:  The patient has no palpable masses. The abdomen has *** tenderness. No hepatosplenomegaly. no hernia.    Back: no CVA tenderness.     Suprapubic area: no tenderness over the bladder. Nondistended.    Genitourinary:     Penis:  ***circumcised phallus, meatus at the tip of the penis with normal caliber. No lesions on the shaft or glans of the penis.     Scrotum: No rashes or lesions.    Left testicle: ***cc. No tenderness, no masses.    Left spermatic cord: *** varicocele, no hydrocele, vas deferens palpable.    Left epididymis: No tenderness. No spermatocele.     Right testicle: ***cc.  No tenderness, no masses.    Right spermatic cord: *** varicocele,  No hydrocele, vas deferens palpable.    Right epididymis: No tenderness. No spermatocele.    Inguinal region: No hernia. No adenopathy.    Extremities: Upper with no deformities. Lower - no edema.    Neurologic: The patient is oriented to person, place and time, and has normal mood and  affect. The patient is dressed appropriately for the weather.    VITALS:   Temp Readings from Last 3 Encounters:   No data found for Temp      BP Readings from Last 3 Encounters:   No data found for BP     Resp Readings from Last 3 Encounters:   No data found for Resp     PF Readings from Last 3 Encounters:   No data found for PF         LABORATORY DATA     No results found for: WBC, HGB, HCT, MCV, PLT    No results found for: NA  No results found for: K  No results found for: CL  No results found for: CO2  No results found for: BUN  No results found for: CREAT  No results found for: GLUCOSE    No results found for: TESTOS  No results found for: TESTOSFREE  No results found for: TESTOSTOT  No results found for: ESTRADIOL  No results found for: LH  No results found for: FSH  No results found for: PROLACTIN      SEMEN ANALYSIS ***    IMPRESSION       ***    RECOMMENDATION       1) Check testosterone, LH, FSH, estradiol and prolactin levels today to further characterize etiology of his symptoms.  Discussed results usually ready in 2-3 days, advised him to call office if he hasn't heard levels by then.     2) I provided him with a list of fertility supplementation to optimize the parameters of his sperm. I recommend a single supplement called ProFertil which encompasses all the individual supplements that I believe are effective.           cc No primary care provider on file.      ***

## 2022-01-30 ENCOUNTER — Ambulatory Visit
